# Patient Record
Sex: Male | Born: 1995 | Race: Black or African American | Hispanic: No | Marital: Single | Smoking: Never smoker
Health system: Southern US, Community
[De-identification: ages and names within clinical notes are randomized; demographics above are authoritative.]

---
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
## 2014-10-28 IMAGING — CT CT ORBITS W/O CM
3 series · 15 of 47 positions shown, 18 images · non-contrast
Comparison: None.

CLINICAL DATA: Facial laceration.  Injury to right eye.

EXAM:
CT ORBITS WITHOUT CONTRAST
TECHNIQUE: Multidetector CT imaging of the orbits was performed following the
standard protocol without intravenous contrast.

[Series 3: orbits 2.0 h30s st · axial · 0.33mm/px · z∈[-118,-34]mm · 9 of 50 slices shown, 12 images]
[im 4/50  brain]
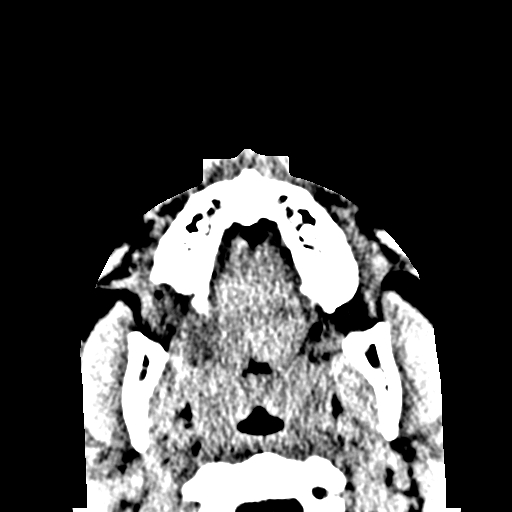
[im 4/50  bone]
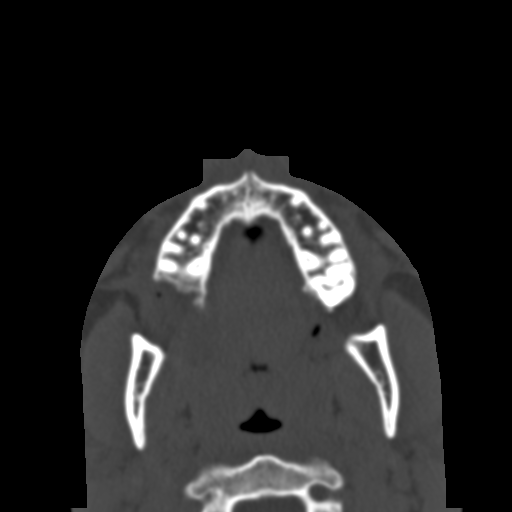
[im 9/50  bone]
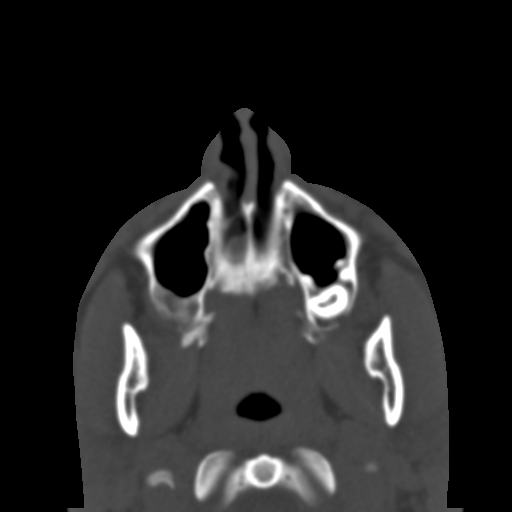
[im 14/50  bone]
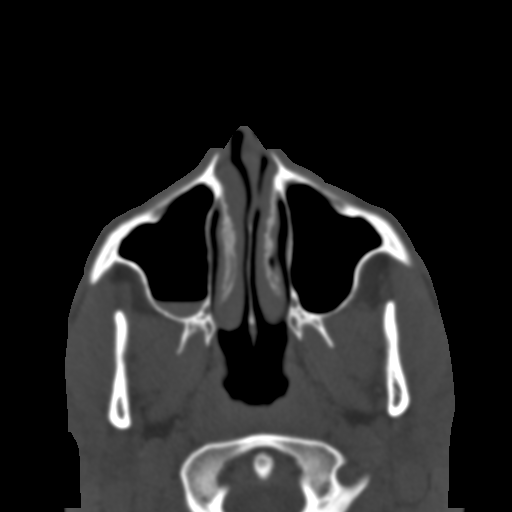
[im 19/50  bone]
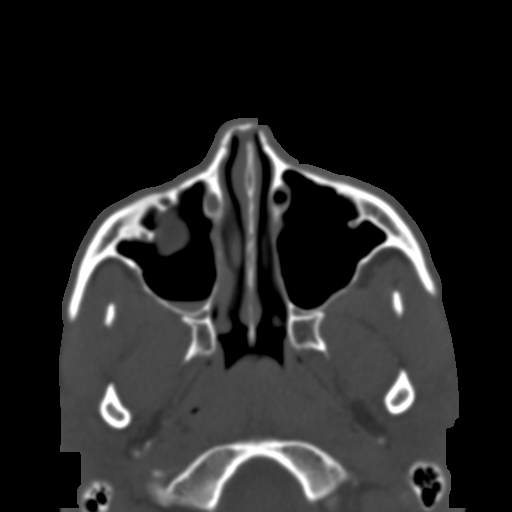
[im 26/50  brain]
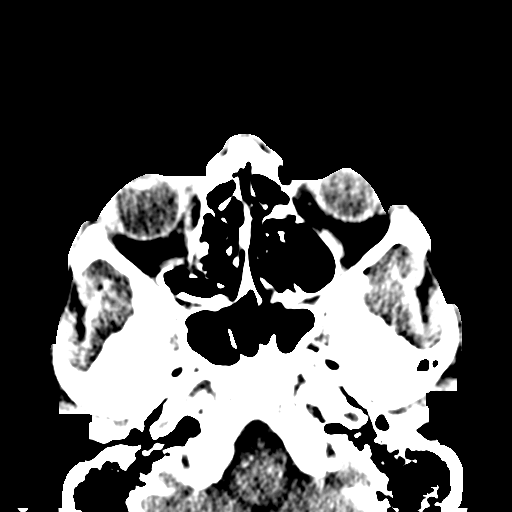
[im 26/50  bone]
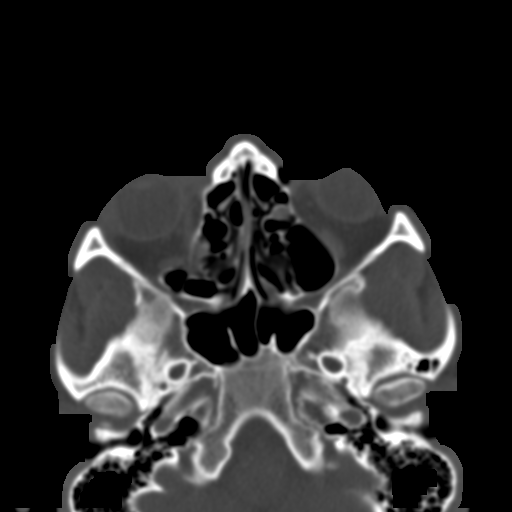
[im 31/50  bone]
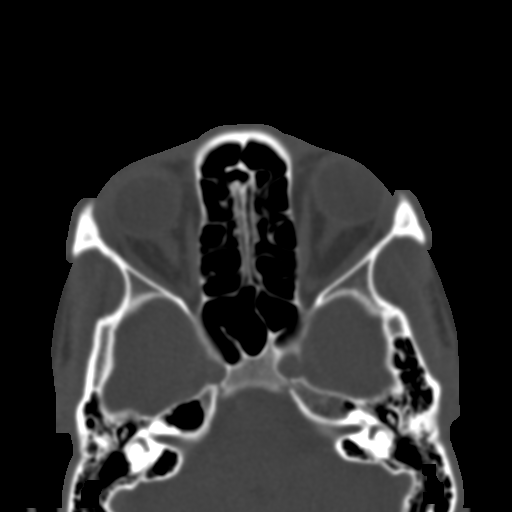
[im 36/50  bone]
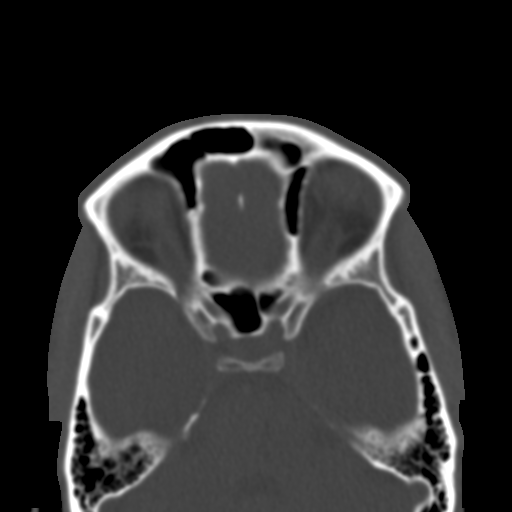
[im 41/50  bone]
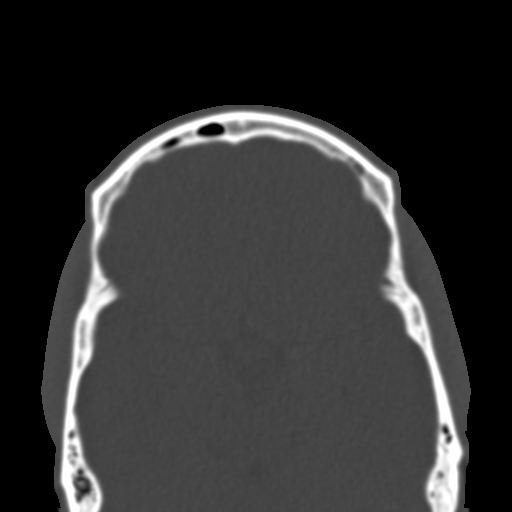
[im 46/50  brain]
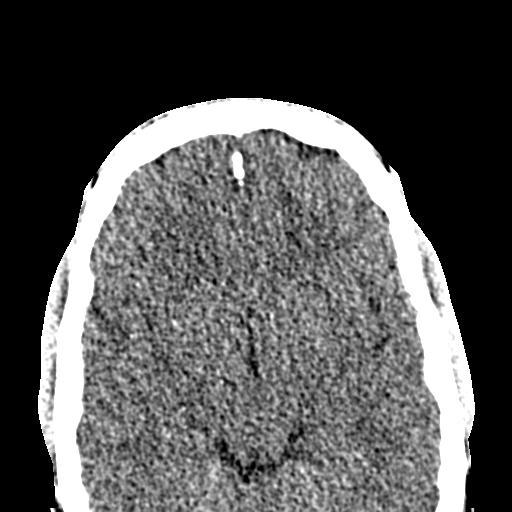
[im 46/50  bone]
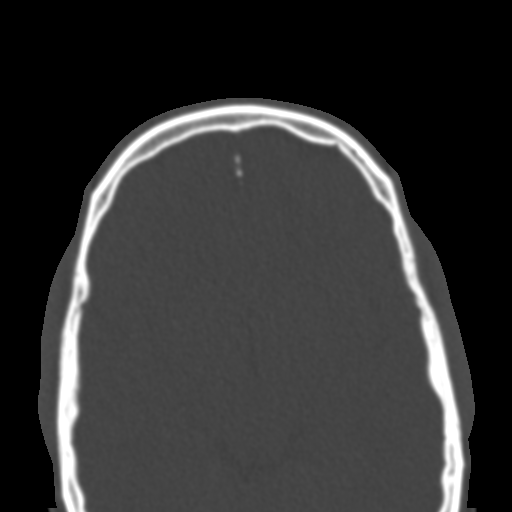

[Series 8: orbits 2.0 coronal · coronal · 0.22mm/px · 3 of 65 slices shown]
[im 22/65  bone]
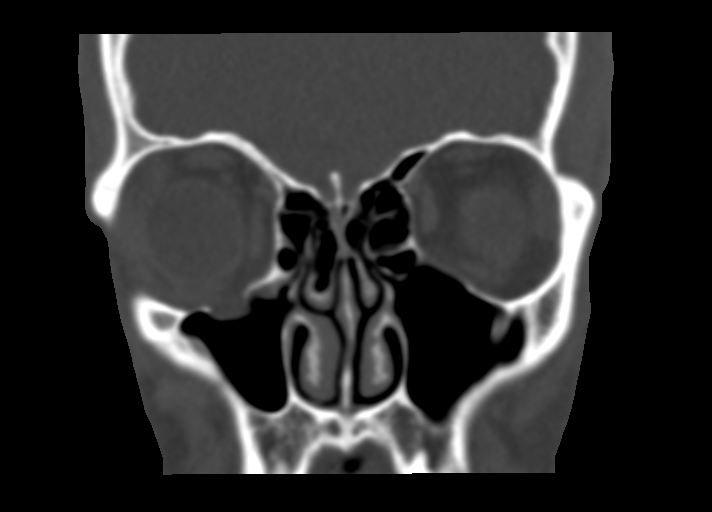
[im 29/65  bone]
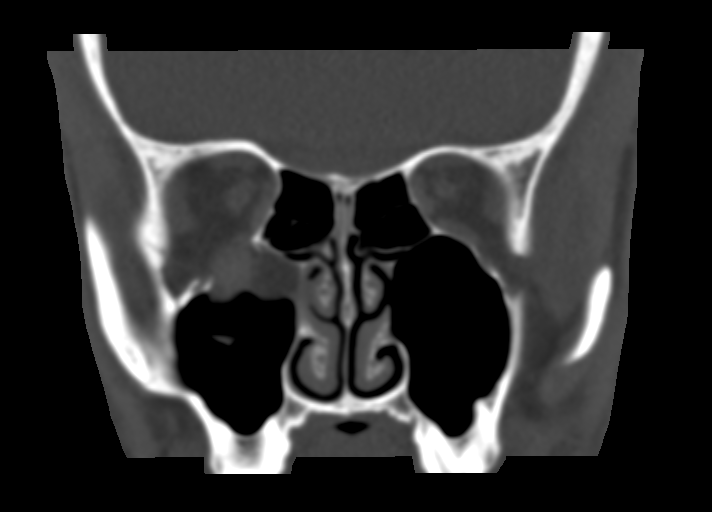
[im 36/65  bone]
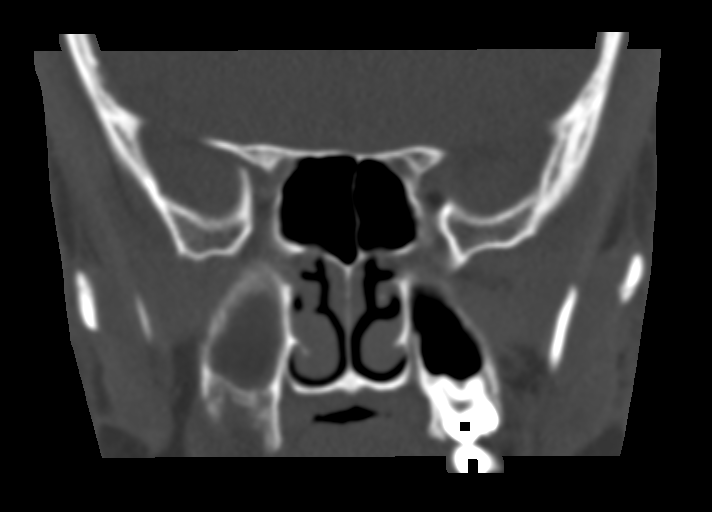

[Series 9: orbits 2.0 sagittal · sagittal · 0.23mm/px · 3 of 75 slices shown]
[im 25/75  bone]
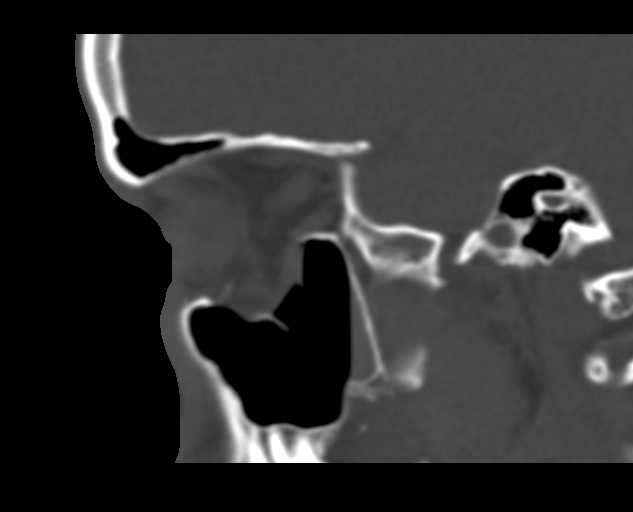
[im 38/75  bone]
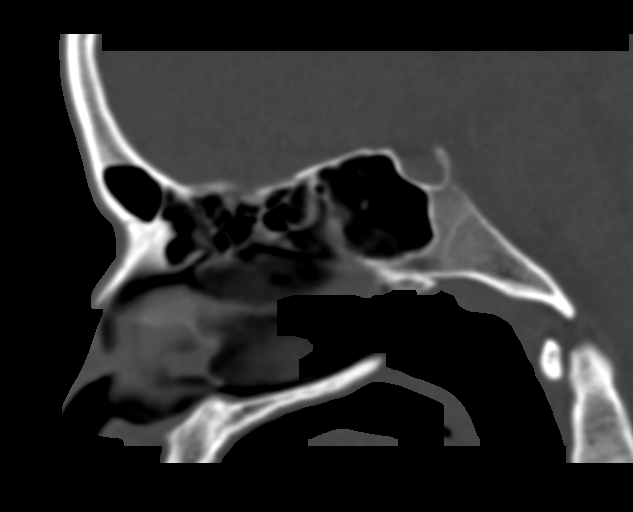
[im 50/75  bone]
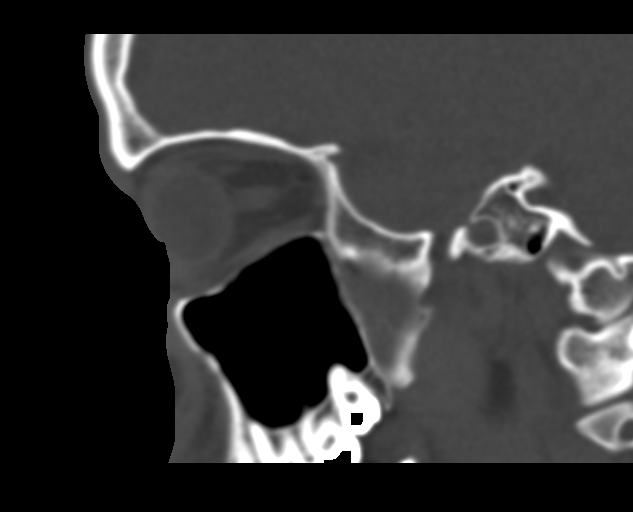

[15 of 47 positions shown; findings below may reference images not displayed]

FINDINGS: There is a right orbital floor fracture. There is mild herniation of
fat and of the inferior rectus muscle. There is right preseptal
swelling. No retrobulbar hematoma is seen. A small amount of blood
is present in the right maxillary sinus. The globes appear intact.
The other paranasal sinuses are clear. The nasal septum is minimally
deviated leftward. The temporomandibular joints are located. The
visualized mastoid air cells are clear. The visualized portion of
the brain is unremarkable.
IMPRESSION: Right orbital floor fracture.

## 2016-06-03 ENCOUNTER — Emergency Department (HOSPITAL_COMMUNITY)
Admission: EM | Admit: 2016-06-03 | Discharge: 2016-06-03 | Disposition: A | Discharge status: Home / Self Care | Payer: BC Managed Care – PPO | Attending: Emergency Medicine | Admitting: Emergency Medicine

## 2016-06-03 ENCOUNTER — Encounter (HOSPITAL_COMMUNITY): Payer: Self-pay | Admitting: *Deleted

## 2016-06-03 DIAGNOSIS — M25512 Pain in left shoulder: Secondary | ICD-10-CM | POA: Diagnosis present

## 2016-06-03 DIAGNOSIS — Y999 Unspecified external cause status: Secondary | ICD-10-CM | POA: Insufficient documentation

## 2016-06-03 DIAGNOSIS — Y939 Activity, unspecified: Secondary | ICD-10-CM | POA: Diagnosis not present

## 2016-06-03 DIAGNOSIS — Y92410 Unspecified street and highway as the place of occurrence of the external cause: Secondary | ICD-10-CM | POA: Diagnosis not present

## 2016-06-03 MED ORDER — IBUPROFEN 600 MG PO TABS: 600.0000 mg | ORAL_TABLET | Freq: Four times a day (QID) | ORAL | 0 refills | Status: AC | PRN

## 2016-06-03 MED ORDER — METHOCARBAMOL 500 MG PO TABS: 500.0000 mg | ORAL_TABLET | Freq: Two times a day (BID) | ORAL | 0 refills | Status: AC
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
## 2021-05-28 ENCOUNTER — Other Ambulatory Visit: Payer: Self-pay

## 2021-05-28 ENCOUNTER — Encounter (HOSPITAL_BASED_OUTPATIENT_CLINIC_OR_DEPARTMENT_OTHER): Payer: Self-pay

## 2021-05-28 ENCOUNTER — Emergency Department (HOSPITAL_BASED_OUTPATIENT_CLINIC_OR_DEPARTMENT_OTHER)
Admission: EM | Admit: 2021-05-28 | Discharge: 2021-05-28 | Disposition: A | Discharge status: Home / Self Care | Payer: 59 | Attending: Emergency Medicine | Admitting: Emergency Medicine

## 2021-05-28 ENCOUNTER — Emergency Department (HOSPITAL_BASED_OUTPATIENT_CLINIC_OR_DEPARTMENT_OTHER): Payer: 59 | Admitting: Radiology

## 2021-05-28 DIAGNOSIS — U071 COVID-19: Secondary | ICD-10-CM | POA: Diagnosis not present

## 2021-05-28 DIAGNOSIS — R0789 Other chest pain: Secondary | ICD-10-CM

## 2021-05-28 DIAGNOSIS — R6889 Other general symptoms and signs: Secondary | ICD-10-CM

## 2021-05-28 LAB — BASIC METABOLIC PANEL
Anion gap: 6 (ref 5–15)
BUN: 11 mg/dL (ref 6–20)
CO2: 29 mmol/L (ref 22–32)
Calcium: 8.8 mg/dL — ABNORMAL LOW (ref 8.9–10.3)
Chloride: 103 mmol/L (ref 98–111)
Creatinine, Ser: 1.15 mg/dL (ref 0.61–1.24)
GFR, Estimated: >60 mL/min (ref >60)
Glucose, Bld: 98 mg/dL (ref 70–99)
Potassium: 3.7 mmol/L (ref 3.5–5.1)
Sodium: 138 mmol/L (ref 135–145)

## 2021-05-28 LAB — RESP PANEL BY RT-PCR (FLU A&B, COVID) ARPGX2
Influenza A by PCR: NEGATIVE
Influenza B by PCR: NEGATIVE
SARS Coronavirus 2 by RT PCR: POSITIVE — AB

## 2021-05-28 LAB — CBC
HCT: 48.6 % (ref 39.0–52.0)
Hemoglobin: 16.4 g/dL (ref 13.0–17.0)
MCH: 30.7 pg (ref 26.0–34.0)
MCHC: 33.7 g/dL (ref 30.0–36.0)
MCV: 91 fL (ref 80.0–100.0)
Platelets: 150 10*3/uL (ref 150–400)
RBC: 5.34 MIL/uL (ref 4.22–5.81)
RDW: 13.2 % (ref 11.5–15.5)
WBC: 5.7 10*3/uL (ref 4.0–10.5)
nRBC: 0 % (ref 0.0–0.2)

## 2021-05-28 LAB — TROPONIN I (HIGH SENSITIVITY)
Troponin I (High Sensitivity): 4 ng/L (ref <18)
Troponin I (High Sensitivity): 5 ng/L (ref <18)

## 2021-05-28 LAB — D-DIMER, QUANTITATIVE: D-Dimer, Quant: <0.27 ug/mL-FEU (ref 0.00–0.50)

## 2021-05-28 MED ORDER — ASPIRIN 325 MG PO TABS
325.0000 mg | ORAL_TABLET | Freq: Every day | ORAL | Status: DC
  Administered 2021-05-28: 16:00:00 325 mg via ORAL
  Filled 2021-05-28: qty 1

## 2021-05-28 MED ORDER — IBUPROFEN 600 MG PO TABS: 600.0000 mg | ORAL_TABLET | Freq: Four times a day (QID) | ORAL | 0 refills | Status: AC | PRN

## 2021-05-28 MED ORDER — NIRMATRELVIR/RITONAVIR (PAXLOVID)TABLET: 3.0000 | ORAL_TABLET | Freq: Two times a day (BID) | ORAL | 0 refills | Status: AC

## 2021-05-28 MED ORDER — ACETAMINOPHEN 325 MG PO TABS: 650.0000 mg | ORAL_TABLET | Freq: Four times a day (QID) | ORAL | 0 refills | Status: AC | PRN

## 2021-05-28 MED ORDER — ALBUTEROL SULFATE HFA 108 (90 BASE) MCG/ACT IN AERS: 1.0000 | INHALATION_SPRAY | Freq: Four times a day (QID) | RESPIRATORY_TRACT | 0 refills | Status: AC | PRN

## 2021-05-28 MED ORDER — SODIUM CHLORIDE 0.9 % IV BOLUS
1000.0000 mL | Freq: Once | INTRAVENOUS | Status: AC
  Administered 2021-05-28: 18:00:00 1000 mL via INTRAVENOUS

## 2021-05-28 MED ORDER — KETOROLAC TROMETHAMINE 30 MG/ML IJ SOLN
30.0000 mg | Freq: Once | INTRAMUSCULAR | Status: AC
  Administered 2021-05-28: 18:00:00 30 mg via INTRAVENOUS
  Filled 2021-05-28: qty 1

## 2021-05-28 MED ORDER — SODIUM CHLORIDE 0.9 % IV BOLUS
1000.0000 mL | Freq: Once | INTRAVENOUS | Status: AC
  Administered 2021-05-28: 17:00:00 1000 mL via INTRAVENOUS

## 2021-05-28 NOTE — ED Notes (Signed)
Called to room by pt, states chest pain is worse, ECG redone and re-evaluated, ED Provider informed.

## 2021-05-28 NOTE — ED Triage Notes (Signed)
Patient here POV from Home with Flu-Like Symptoms.   Symptoms have been present since yesterday but worsened this AM. Symptoms include Cough, Fever, Body Aches, Weakness, etc. Mother has been treating with OTC Medications.  Ambulatory. NAD Noted during Triage. A&Ox4. GCS 15.

## 2021-05-28 NOTE — ED Provider Notes (Signed)
[facility] Provider Note   CSN: [id number] Arrival date & time: 05/28/21  1534     History Chief Complaint  Patient presents with   Cough    Larry Cannon is a 25 y.o. male.  This is a 25 y.o. male without significant medical history  who presents to the ED with complaint of multiple complaints, including: Chills, body aches, cough, congestion, chest pain has been intermittent, mild dyspnea.  Patient ports intermittent chest pain, described as sharp, stabbing, midsternal.  Nonradiating.  Not associate with exertion.  Sometimes worsened with deep inspiration.  Not improved with rest.  Pain lasting approximately 3 to 4 seconds and resolve spontaneously.  He has not experienced similar symptoms in the past.  No recent alcohol or illicit drug use.  No IVDU.  No recent stimulant use.  No recent medication or dietary changes.  No first relatives with MI prior to age 109.  Patient denies personal history of cardiac abnormalities.  The history is provided by the patient and a relative (Mother). No language interpreter was used.  Cough Associated symptoms: chest pain, chills, fever, myalgias and shortness of breath   Associated symptoms: no headaches and no rash       History reviewed. No pertinent past medical history.  There are no problems to display for this patient.   History reviewed. No pertinent surgical history.     History reviewed. No pertinent family history.  Social History   Tobacco Use   Smoking status: Never   Smokeless tobacco: Never  Substance Use Topics   Alcohol use: No   Drug use: No    Home Medications Prior to Admission medications   Medication Sig Start Date End Date Taking? Authorizing Provider  acetaminophen (TYLENOL) 325 MG tablet Take 2 tablets (650 mg total) by mouth every 6 (six) hours as needed. 05/28/21  Yes Wynona Dove A, DO  albuterol (VENTOLIN HFA) 108 (90 Base) MCG/ACT inhaler Inhale 1-2 puffs into the lungs  every 6 (six) hours as needed for wheezing or shortness of breath. 05/28/21  Yes Wynona Dove A, DO  ibuprofen (ADVIL) 600 MG tablet Take 1 tablet (600 mg total) by mouth every 6 (six) hours as needed. 05/28/21  Yes Jeanell Sparrow, DO  nirmatrelvir/ritonavir EUA (PAXLOVID) 20 x 150 MG & 10 x 100MG  TABS Take 3 tablets by mouth 2 (two) times daily for 5 days. Take nirmatrelvir (150 mg) two tablets twice daily for 5 days and ritonavir (100 mg) one tablet twice daily for 5 days. 05/28/21 06/02/21 Yes Jeanell Sparrow, DO  cephALEXin (KEFLEX) 500 MG capsule Take 1 capsule (500 mg total) by mouth 4 (four) times daily. 08/23/13   Charlann Lange, PA-C  ibuprofen (ADVIL,MOTRIN) 600 MG tablet Take 1 tablet (600 mg total) by mouth every 6 (six) hours as needed. 06/03/16   Lacretia Leigh, MD  methocarbamol (ROBAXIN) 500 MG tablet Take 1 tablet (500 mg total) by mouth 2 (two) times daily. 06/03/16   Lacretia Leigh, MD  oxyCODONE-acetaminophen (PERCOCET/ROXICET) 5-325 MG per tablet Take 1-2 tablets by mouth every 4 (four) hours as needed for severe pain. 08/23/13   Charlann Lange, PA-C    Allergies    Patient has no known allergies.  Review of Systems   Review of Systems  Constitutional:  Positive for chills, fatigue and fever.  HENT:  Negative for facial swelling and trouble swallowing.   Eyes:  Negative for photophobia and visual disturbance.  Respiratory:  Positive for cough and shortness  of breath.   Cardiovascular:  Positive for chest pain. Negative for palpitations.  Gastrointestinal:  Negative for abdominal pain, nausea and vomiting.  Endocrine: Negative for polydipsia and polyuria.  Genitourinary:  Negative for difficulty urinating and hematuria.  Musculoskeletal:  Positive for arthralgias and myalgias. Negative for gait problem and joint swelling.  Skin:  Negative for pallor and rash.  Neurological:  Negative for syncope and headaches.  Psychiatric/Behavioral:  Negative for agitation and confusion.     Physical Exam Updated Vital Signs BP (!) 134/92    Pulse 68    Temp 98.6 F (37 C)    Resp (!) 23    Ht 5\' 11"  (1.803 m)    Wt 86.2 kg    SpO2 99%    BMI 26.50 kg/m   Physical Exam Vitals and nursing note reviewed.  Constitutional:      General: He is not in acute distress.    Appearance: Normal appearance. He is well-developed. He is not ill-appearing, toxic-appearing or diaphoretic.  HENT:     Head: Normocephalic and atraumatic.     Right Ear: External ear normal.     Left Ear: External ear normal.     Mouth/Throat:     Mouth: Mucous membranes are moist.  Eyes:     General: No scleral icterus. Cardiovascular:     Rate and Rhythm: Normal rate and regular rhythm.     Pulses: Normal pulses.     Heart sounds: Normal heart sounds.  Pulmonary:     Effort: Pulmonary effort is normal. No respiratory distress.     Breath sounds: Normal breath sounds.  Abdominal:     General: Abdomen is flat.     Palpations: Abdomen is soft.     Tenderness: There is no abdominal tenderness.  Musculoskeletal:        General: Normal range of motion.     Cervical back: Full passive range of motion without pain and normal range of motion.     Right lower leg: No edema.     Left lower leg: No edema.  Skin:    General: Skin is warm and dry.     Capillary Refill: Capillary refill takes less than 2 seconds.  Neurological:     Mental Status: He is alert and oriented to person, place, and time.  Psychiatric:        Mood and Affect: Mood normal.        Behavior: Behavior normal.    ED Results / Procedures / Treatments   Labs (all labs ordered are listed, but only abnormal results are displayed) Labs Reviewed  RESP PANEL BY RT-PCR (FLU A&B, COVID) ARPGX2 - Abnormal; Notable for the following components:      Result Value   SARS Coronavirus 2 by RT PCR POSITIVE (*)    All other components within normal limits  BASIC METABOLIC PANEL - Abnormal; Notable for the following components:   Calcium 8.8  (*)    All other components within normal limits  CBC  D-DIMER, QUANTITATIVE  TROPONIN I (HIGH SENSITIVITY)  TROPONIN I (HIGH SENSITIVITY)    EKG EKG Interpretation  Date/Time:  Monday May 28 2021 16:10:11 EST Ventricular Rate:  72 PR Interval:  186 QRS Duration: 84 QT Interval:  356 QTC Calculation: 390 R Axis:   68 Text Interpretation: Sinus rhythm Borderline T wave abnormalities ST elev, probable normal early repol pattern Similar to recent tracing Confirmed by [id number]) on 05/28/2021 4:40:54 PM  Radiology DG Chest [facility]  1 View  Result Date: 05/28/2021 CLINICAL DATA:  Cough, fever. EXAM: PORTABLE CHEST 1 VIEW COMPARISON:  None. FINDINGS: The heart size and mediastinal contours are within normal limits. Both lungs are clear. The visualized skeletal structures are unremarkable. IMPRESSION: No active disease. Electronically Signed   By: Marijo Conception M.D.   On: 05/28/2021 16:36    Procedures Procedures   Medications Ordered in ED Medications  aspirin tablet 325 mg (325 mg Oral Given 05/28/21 1627)  sodium chloride 0.9 % bolus 1,000 mL (0 mLs Intravenous Stopped 05/28/21 1737)  sodium chloride 0.9 % bolus 1,000 mL (1,000 mLs Intravenous New Bag/Given 05/28/21 1755)  ketorolac (TORADOL) 30 MG/ML injection 30 mg (30 mg Intravenous Given 05/28/21 1753)    ED Course  I have reviewed the triage vital signs and the nursing notes.  Pertinent labs & imaging results that were available during my care of the patient were reviewed by me and considered in my medical decision making (see chart for details).    MDM Rules/Calculators/A&P                          CC: Arthralgias, fatigue, chills, chest pain, dyspnea  This patient complains of above; this involves an extensive number of treatment options and is a complaint that carries with it a high risk of complications and morbidity. Vital signs were reviewed. Serious etiologies considered.  Record review:   Previous records obtained and reviewed   Additional history obtained from mother  Work up as above, notable for:  Labs & imaging results that were available during my care of the patient were reviewed by me and considered in my medical decision making.   I ordered imaging studies which included CXR and I independently visualized and interpreted imaging which showed no acute findings  EKG appears abnormal, concern for possible early repolarization.  Patient with ongoing chest pain.  Repeat EKG.  EKG does appear similar.  No progression.  Again suspicion for benign early repolarization.  Patient given aspirin, IV fluids.  Will obtain laboratory evaluation given chest discomfort.  Patient found to be positive for COVID-19.   Well's score is low, d-dimer is negative, PE is unlikely.    Pt feeling better, symptoms likely 2/2 covid 19 infection. He has received vaccine. Discussed strict return precautions and supportive care at home. Discussed paxlovid, he is interested in taking this. He has no contraindications, GFR >60  The patient's chest pain is not suggestive of pulmonary embolus, cardiac ischemia, aortic dissection, pericarditis, myocarditis, pulmonary embolism, pneumothorax, pneumonia, Zoster, or esophageal perforation, or other serious etiology.  Historically not abrupt in onset, tearing or ripping, pulses symmetric. EKG nonspecific for ischemia/infarction. No dysrhythmias, brugada, WPW, prolonged QT noted. Troponin negative x2. CXR reviewed. Labs without demonstration of acute pathology unless otherwise noted above. Low HEART Score: 0-3 points (0.9-1.7% risk of MACE). Given the extremely low risk of these diagnoses further testing and evaluation for these possibilities does not appear to be indicated at this time. Patient in no distress and overall condition improved here in the ED. Detailed discussions were had with the patient regarding current findings, and need for close f/u with PCP or  on call doctor. The patient has been instructed to return immediately if the symptoms worsen in any way for re-evaluation. Patient verbalized understanding and is in agreement with current care plan. All questions answered prior to discharge.    This chart was dictated using voice recognition software.  Despite best efforts to proofread,  errors can occur which can change the documentation meaning.    Final Clinical Impression(s) / ED Diagnoses Final diagnoses:  COVID-19  Atypical chest pain    Rx / DC Orders ED Discharge Orders          Ordered    nirmatrelvir/ritonavir EUA (PAXLOVID) 20 x 150 MG & 10 x 100MG  TABS  2 times daily        05/28/21 1841    ibuprofen (ADVIL) 600 MG tablet  Every 6 hours PRN        05/28/21 1841    acetaminophen (TYLENOL) 325 MG tablet  Every 6 hours PRN        05/28/21 1841    albuterol (VENTOLIN HFA) 108 (90 Base) MCG/ACT inhaler  Every 6 hours PRN        05/28/21 1841             Jeanell Sparrow, DO 05/28/21 1843

## 2021-09-27 NOTE — Progress Notes (Deleted)
   New Patient Office Visit  Subjective    Patient ID: Larry Cannon, male    DOB: 07/16/1995  Age: 26 y.o. MRN: [id number]  CC: No chief complaint on file.   HPI Larry Cannon presents for new patient visit to establish care.  Introduced to Publishing rights manager role and practice setting.  All questions answered.  Discussed provider/patient relationship and expectations.   Outpatient Encounter Medications as of 09/28/2021  Medication Sig   acetaminophen (TYLENOL) 325 MG tablet Take 2 tablets (650 mg total) by mouth every 6 (six) hours as needed.   albuterol (VENTOLIN HFA) 108 (90 Base) MCG/ACT inhaler Inhale 1-2 puffs into the lungs every 6 (six) hours as needed for wheezing or shortness of breath.   cephALEXin (KEFLEX) 500 MG capsule Take 1 capsule (500 mg total) by mouth 4 (four) times daily.   ibuprofen (ADVIL) 600 MG tablet Take 1 tablet (600 mg total) by mouth every 6 (six) hours as needed.   ibuprofen (ADVIL,MOTRIN) 600 MG tablet Take 1 tablet (600 mg total) by mouth every 6 (six) hours as needed.   methocarbamol (ROBAXIN) 500 MG tablet Take 1 tablet (500 mg total) by mouth 2 (two) times daily.   oxyCODONE-acetaminophen (PERCOCET/ROXICET) 5-325 MG per tablet Take 1-2 tablets by mouth every 4 (four) hours as needed for severe pain.   No facility-administered encounter medications on file as of 09/28/2021.    No past medical history on file.  No past surgical history on file.  No family history on file.  Social History   Socioeconomic History   Marital status: Single    Spouse name: Not on file   Number of children: Not on file   Years of education: Not on file   Highest education level: Not on file  Occupational History   Not on file  Tobacco Use   Smoking status: Never   Smokeless tobacco: Never  Substance and Sexual Activity   Alcohol use: No   Drug use: No   Sexual activity: Not on file  Other Topics Concern   Not on file  Social History Narrative   Not on file    Social Determinants of Health   Financial Resource Strain: Not on file  Food Insecurity: Not on file  Transportation Needs: Not on file  Physical Activity: Not on file  Stress: Not on file  Social Connections: Not on file  Intimate Partner Violence: Not on file    ROS      Objective    There were no vitals taken for this visit.  Physical Exam  {Labs (Optional):23779}    Assessment & Plan:   Problem List Items Addressed This Visit   None   No follow-ups on file.   Gerre Scull, NP

## 2021-09-28 ENCOUNTER — Ambulatory Visit: Payer: 59 | Admitting: Nurse Practitioner

## 2021-12-03 ENCOUNTER — Emergency Department (HOSPITAL_BASED_OUTPATIENT_CLINIC_OR_DEPARTMENT_OTHER)
Admission: EM | Admit: 2021-12-03 | Discharge: 2021-12-03 | Disposition: A | Discharge status: Home / Self Care | Payer: 59 | Attending: Emergency Medicine | Admitting: Emergency Medicine

## 2021-12-03 ENCOUNTER — Emergency Department (HOSPITAL_BASED_OUTPATIENT_CLINIC_OR_DEPARTMENT_OTHER): Payer: 59 | Admitting: Radiology

## 2021-12-03 ENCOUNTER — Encounter (HOSPITAL_BASED_OUTPATIENT_CLINIC_OR_DEPARTMENT_OTHER): Payer: Self-pay

## 2021-12-03 ENCOUNTER — Other Ambulatory Visit: Payer: Self-pay

## 2021-12-03 DIAGNOSIS — W458XXA Other foreign body or object entering through skin, initial encounter: Secondary | ICD-10-CM | POA: Diagnosis not present

## 2021-12-03 DIAGNOSIS — Z23 Encounter for immunization: Secondary | ICD-10-CM | POA: Diagnosis not present

## 2021-12-03 DIAGNOSIS — S60454A Superficial foreign body of right ring finger, initial encounter: Secondary | ICD-10-CM | POA: Insufficient documentation

## 2021-12-03 DIAGNOSIS — Y9389 Activity, other specified: Secondary | ICD-10-CM | POA: Diagnosis not present

## 2021-12-03 DIAGNOSIS — M795 Residual foreign body in soft tissue: Secondary | ICD-10-CM

## 2021-12-03 DIAGNOSIS — S6991XA Unspecified injury of right wrist, hand and finger(s), initial encounter: Secondary | ICD-10-CM | POA: Diagnosis present

## 2021-12-03 DIAGNOSIS — R000 Tachycardia, unspecified: Secondary | ICD-10-CM | POA: Insufficient documentation

## 2021-12-03 MED ORDER — LIDOCAINE HCL (PF) 1 % IJ SOLN
5.0000 mL | Freq: Once | INTRAMUSCULAR | Status: AC
  Administered 2021-12-03: 5 mL
  Filled 2021-12-03: qty 5

## 2021-12-03 MED ORDER — TETANUS-DIPHTH-ACELL PERTUSSIS 5-2.5-18.5 LF-MCG/0.5 IM SUSY
0.5000 mL | PREFILLED_SYRINGE | Freq: Once | INTRAMUSCULAR | Status: AC
  Administered 2021-12-03: 0.5 mL via INTRAMUSCULAR
  Filled 2021-12-03: qty 0.5
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
## 2022-08-02 IMAGING — DX DG CHEST 1V PORT
2 series · 2 of 2 positions shown · non-contrast
Comparison: None.

CLINICAL DATA: Cough, fever.

EXAM:
PORTABLE CHEST 1 VIEW

[chest (1 of 2)]
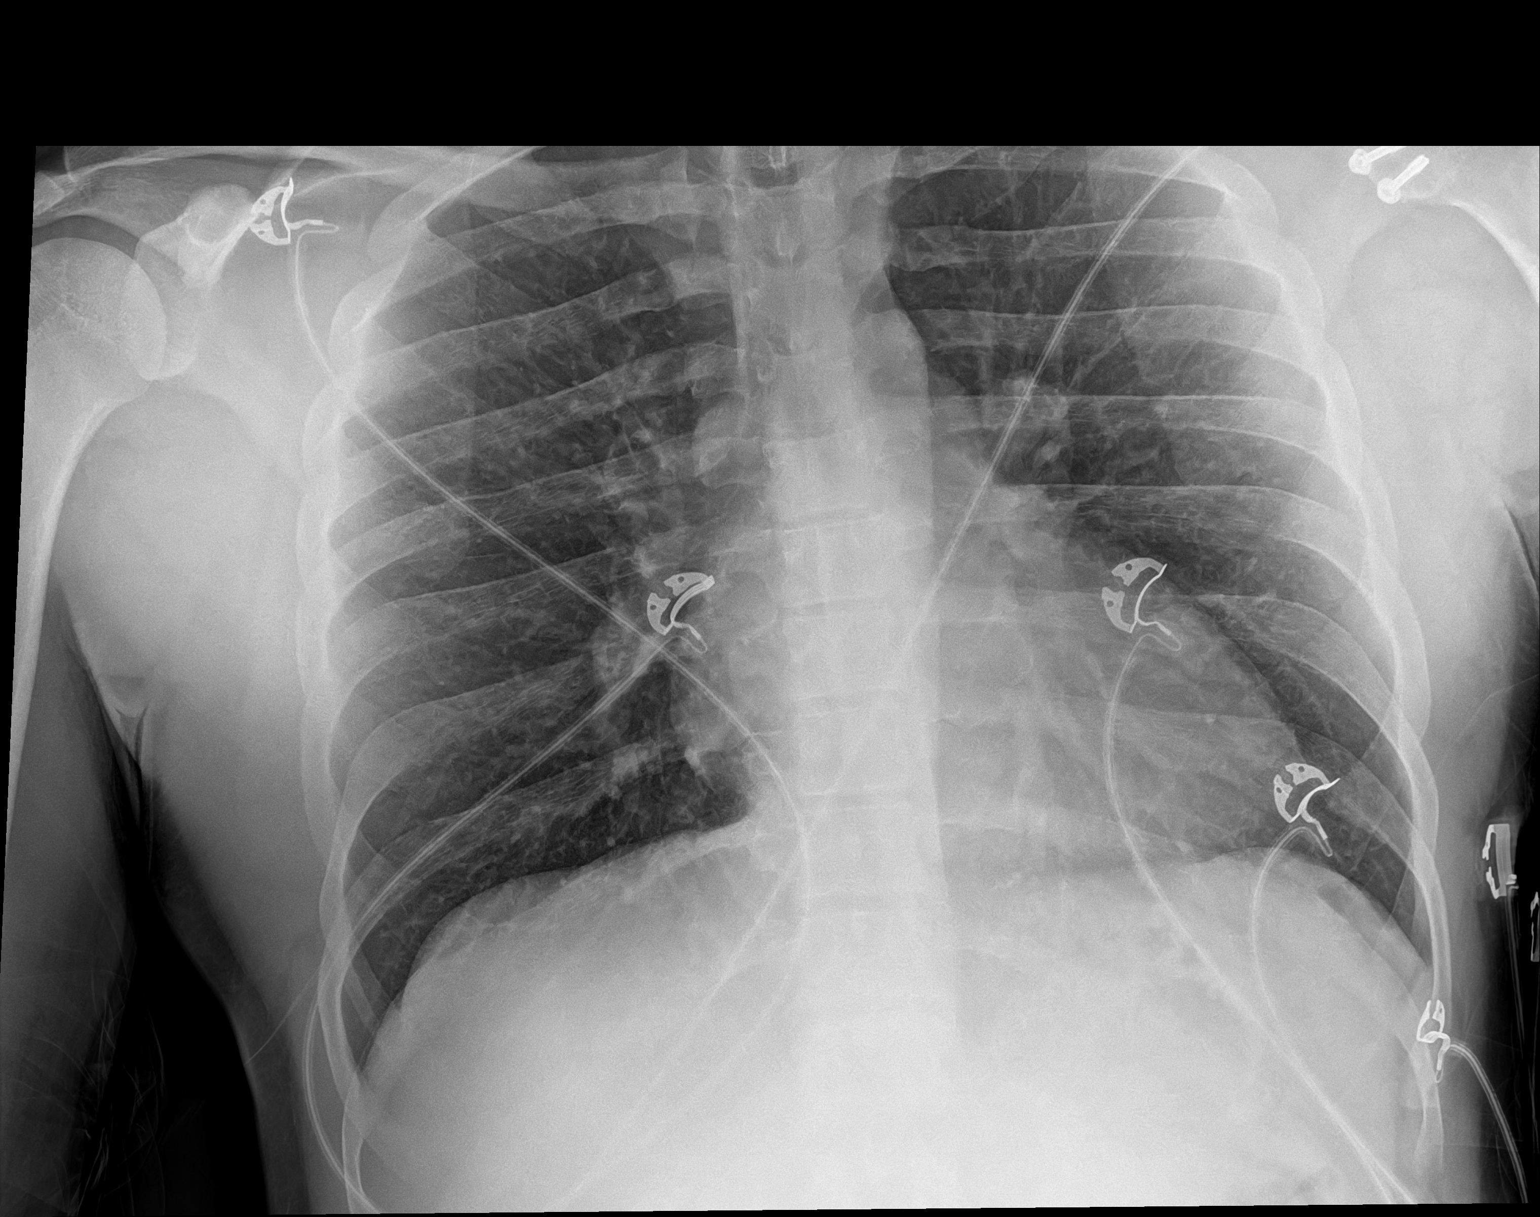

[chest (2 of 2)]
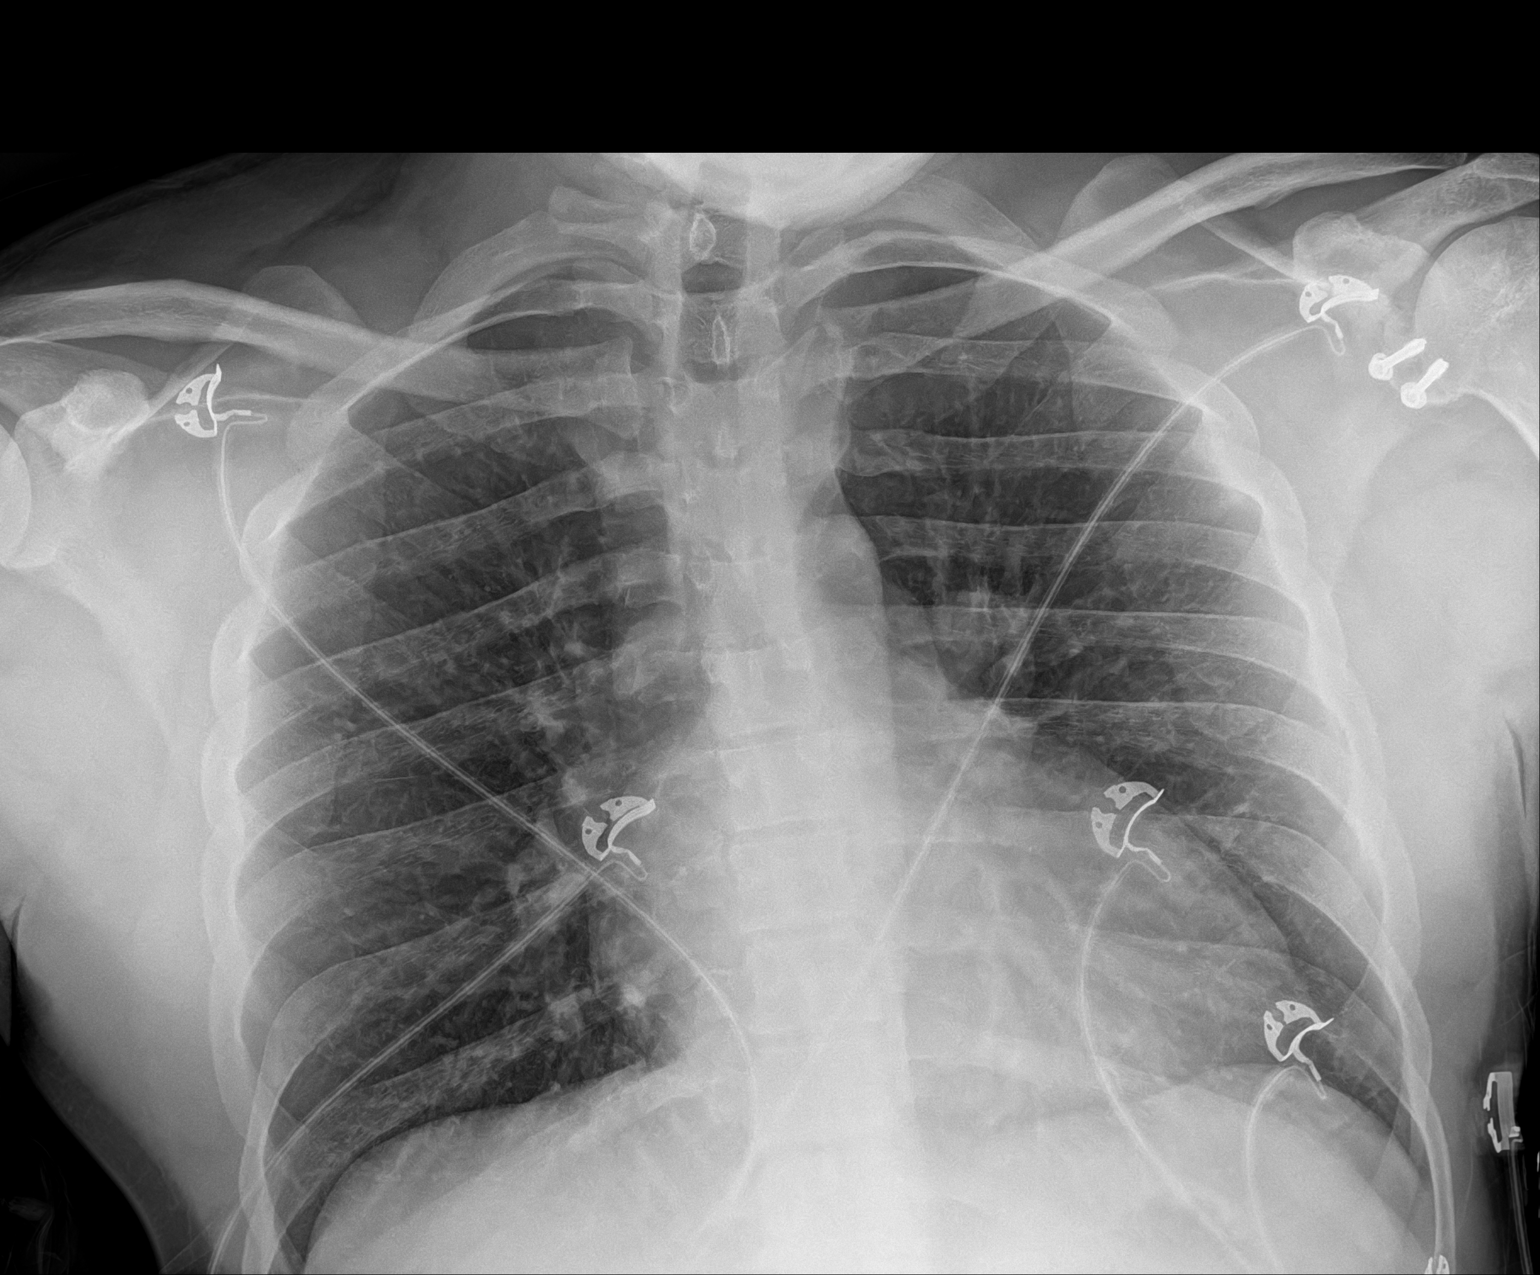

[2 of 2 positions shown; findings below may reference images not displayed]

FINDINGS: The heart size and mediastinal contours are within normal limits.
Both lungs are clear. The visualized skeletal structures are
unremarkable.
IMPRESSION: No active disease.

## 2024-05-09 ENCOUNTER — Emergency Department (HOSPITAL_BASED_OUTPATIENT_CLINIC_OR_DEPARTMENT_OTHER)
Admission: EM | Admit: 2024-05-09 | Discharge: 2024-05-09 | Disposition: A | Discharge status: Home / Self Care | Attending: Emergency Medicine | Admitting: Emergency Medicine

## 2024-05-09 ENCOUNTER — Emergency Department (HOSPITAL_BASED_OUTPATIENT_CLINIC_OR_DEPARTMENT_OTHER): Admitting: Radiology

## 2024-05-09 ENCOUNTER — Encounter (HOSPITAL_BASED_OUTPATIENT_CLINIC_OR_DEPARTMENT_OTHER): Payer: Self-pay

## 2024-05-09 DIAGNOSIS — M79641 Pain in right hand: Secondary | ICD-10-CM | POA: Diagnosis not present

## 2024-05-09 DIAGNOSIS — Y9302 Activity, running: Secondary | ICD-10-CM | POA: Diagnosis not present

## 2024-05-09 DIAGNOSIS — S60221A Contusion of right hand, initial encounter: Secondary | ICD-10-CM | POA: Diagnosis not present

## 2024-05-09 DIAGNOSIS — W01198A Fall on same level from slipping, tripping and stumbling with subsequent striking against other object, initial encounter: Secondary | ICD-10-CM | POA: Diagnosis not present

## 2024-05-09 DIAGNOSIS — S6991XA Unspecified injury of right wrist, hand and finger(s), initial encounter: Secondary | ICD-10-CM | POA: Diagnosis present

## 2024-05-09 NOTE — ED Notes (Signed)
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
